# Patient Record
Sex: Male | Born: 1995 | Race: White | Hispanic: No | Marital: Single | Smoking: Former smoker
Health system: Southern US, Community
[De-identification: ages and names within clinical notes are randomized; demographics above are authoritative.]

## PROBLEM LIST (undated history)

## (undated) DIAGNOSIS — E119 Type 2 diabetes mellitus without complications: Secondary | ICD-10-CM

## (undated) DIAGNOSIS — J45909 Unspecified asthma, uncomplicated: Secondary | ICD-10-CM

---
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
## 2020-07-05 DIAGNOSIS — J029 Acute pharyngitis, unspecified: Secondary | ICD-10-CM | POA: Diagnosis not present

## 2020-07-05 DIAGNOSIS — Z6841 Body Mass Index (BMI) 40.0 and over, adult: Secondary | ICD-10-CM | POA: Diagnosis not present

## 2021-04-25 ENCOUNTER — Ambulatory Visit
Admission: EM | Admit: 2021-04-25 | Discharge: 2021-04-25 | Disposition: A | Discharge status: Home / Self Care | Payer: Commercial Managed Care - PPO | Attending: Family Medicine | Admitting: Family Medicine

## 2021-04-25 ENCOUNTER — Other Ambulatory Visit: Payer: Self-pay

## 2021-04-25 DIAGNOSIS — I809 Phlebitis and thrombophlebitis of unspecified site: Secondary | ICD-10-CM | POA: Diagnosis not present

## 2021-04-25 DIAGNOSIS — L97519 Non-pressure chronic ulcer of other part of right foot with unspecified severity: Secondary | ICD-10-CM

## 2021-04-25 HISTORY — DX: Type 2 diabetes mellitus without complications: E11.9

## 2021-04-25 MED ORDER — SULFAMETHOXAZOLE-TRIMETHOPRIM 800-160 MG PO TABS: 1.0000 | ORAL_TABLET | Freq: Two times a day (BID) | ORAL | 0 refills | Status: AC

## 2021-04-25 NOTE — Discharge Instructions (Addendum)
May call vascular imaging center at [phone] Mon through Fri 8-5 to schedule a DVT ultrasound if your symptoms are not improving

## 2021-04-25 NOTE — ED Provider Notes (Signed)
[facility]    CSN: [id number] Arrival date & time: 04/25/21  1818      History   Chief Complaint Chief Complaint  Patient presents with   Foot Pain    HPI Daryl Vaughn is a 25 y.o. male.   Pt presents with right sided lower leg pain, redness, edema x 4 days. Pt does have healing sore on the bottom of his right foot but states this has been ongoing and improving for several weeks. 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  No past history of DVT, PE.  Denies chest pain, shortness of breath, palpitations, dizziness.     Past Medical History:  Diagnosis Date   Asthma    Diabetes mellitus without complication (HCC)     There are no problems to display for this patient.   History reviewed. No pertinent surgical history.     Home Medications    Prior to Admission medications   Medication Sig Start Date End Date Taking? Authorizing Provider  sulfamethoxazole-trimethoprim (BACTRIM DS) 800-160 MG tablet Take 1 tablet by mouth 2 (two) times daily for 7 days. 04/25/21 05/02/21 Yes Particia Nearing, PA-C  amoxicillin (AMOXIL) 500 MG capsule Take 1 capsule (500 mg total) by mouth 3 (three) times daily. 10/26/12   Janne Napoleon, NP  ibuprofen (ADVIL,MOTRIN) 200 MG tablet Take 200 mg by mouth once as needed for pain.    [provider]  lisdexamfetamine (VYVANSE) 70 MG capsule Take 70 mg by mouth every morning.    [provider]    Family History Family History  Problem Relation Age of Onset   Diabetes Mother    Healthy Father     Social History Social History   Tobacco Use   Smoking status: Never  Substance Use Topics   Alcohol use: No     Allergies   Patient has no  known allergies.   Review of Systems Review of Systems PER HPI   Physical Exam Triage Vital Signs ED Triage Vitals  Enc Vitals Group     BP 04/25/21 1844 (!) 152/86     Pulse Rate 04/25/21 1844 93     Resp 04/25/21 1844 19     Temp 04/25/21 1844 98.7 F (37.1 C)     Temp src --      SpO2 04/25/21 1844 95 %     Weight --      Height --      Head Circumference --      Peak Flow --      Pain Score 04/25/21 1843 4     Pain Loc --      Pain Edu? --      Excl. in GC? --    No data found.  Updated Vital Signs BP (!) 152/86    Pulse 93    Temp 98.7 F (37.1 C)    Resp 19    SpO2 95%   Visual Acuity Right Eye Distance:   Left Eye Distance:   Bilateral Distance:    Right Eye Near:   Left Eye Near:    Bilateral Near:     Physical Exam Vitals and nursing note reviewed.  Constitutional:  Appearance: Normal appearance.  HENT:     Head: Atraumatic.  Eyes:     Extraocular Movements: Extraocular movements intact.     Conjunctiva/sclera: Conjunctivae normal.  Cardiovascular:     Rate and Rhythm: Normal rate and regular rhythm.  Pulmonary:     Effort: Pulmonary effort is normal.     Breath sounds: Normal breath sounds.  Musculoskeletal:        General: Swelling and tenderness present. Normal range of motion.     Cervical back: Normal range of motion and neck supple.     Comments: No posterior calf tenderness, swelling.  Negative Homans' sign, negative squeeze test  Skin:    General: Skin is warm.     Findings: Erythema present.     Comments: Right medial lower leg just above the ankle with firm tender erythematous nodular region with streaking and erythema, warmth extending down into medial foot.  Superficial ulceration at base of right foot, no erythema, bleeding, drainage, edema to the area.  Patient states this area of ulceration has been ongoing for several weeks.  Neurological:     General: No focal deficit present.     Mental Status: He is oriented to person,  place, and time.     Comments: Right lower leg neurovascularly intact  Psychiatric:        Mood and Affect: Mood normal.        Thought Content: Thought content normal.        Judgment: Judgment normal.     UC Treatments / Results  Labs (all labs ordered are listed, but only abnormal results are displayed) Labs Reviewed - No data to display  EKG   Radiology No results found.  Procedures Procedures (including critical care time)  Medications Ordered in UC Medications - No data to display  Initial Impression / Assessment and Plan / UC Course  I have reviewed the triage vital signs and the nursing notes.  Pertinent labs & imaging results that were available during my care of the patient were reviewed by me and considered in my medical decision making (see chart for details).     Low suspicion for DVT, suspect superficial phlebitis to be causing symptoms.  We will order a DVT ultrasound for rule out and safety check, given information for calling and scheduling this as it is currently after hours.  Discussed to go to the emergency department if worsening symptoms at any time.  We will treat with Bactrim, warm compresses, compression and monitor for improvement.  Work note given.  Final Clinical Impressions(s) / UC Diagnoses   Final diagnoses:  Superficial phlebitis  Ulcer of right foot, unspecified ulcer stage [facility])     Discharge Instructions      May call vascular imaging center at [phone] Mon through Fri 8-5 to schedule a DVT ultrasound if your symptoms are not improving    ED Prescriptions     Medication Sig Dispense Auth. Provider   sulfamethoxazole-trimethoprim (BACTRIM DS) 800-160 MG tablet Take 1 tablet by mouth 2 (two) times daily for 7 days. 14 tablet Particia Nearing, [place]      PDMP not reviewed this encounter.   Particia Nearing, [place] 04/25/21 1930

## 2021-04-25 NOTE — ED Triage Notes (Signed)
Pt presents with right sided lower leg pain x 4 days. Area is red warm and hard to touch on the inner side of lower leg. Pt does have healing sore on the bottom of his foot. Denies any injury.

## 2021-04-26 ENCOUNTER — Other Ambulatory Visit (HOSPITAL_COMMUNITY): Payer: Self-pay | Admitting: Family Medicine

## 2021-04-26 ENCOUNTER — Inpatient Hospital Stay (HOSPITAL_COMMUNITY): Admission: RE | Admit: 2021-04-26 | Payer: Commercial Managed Care - PPO | Source: Ambulatory Visit

## 2021-04-26 DIAGNOSIS — M79604 Pain in right leg: Secondary | ICD-10-CM

## 2022-04-13 ENCOUNTER — Ambulatory Visit
Admission: EM | Admit: 2022-04-13 | Discharge: 2022-04-13 | Disposition: A | Discharge status: Home / Self Care | Payer: Commercial Managed Care - PPO

## 2022-04-13 DIAGNOSIS — H6122 Impacted cerumen, left ear: Secondary | ICD-10-CM

## 2022-04-13 NOTE — ED Provider Notes (Signed)
[facility]    CSN: [id number] Arrival date & time: 04/13/22  1435      History   Chief Complaint Chief Complaint  Patient presents with   Otalgia          HPI SENECA HOBACK is a 26 y.o. male.   The history is provided by the patient.   [place] for complaints of decreased hearing from the left ear.  Patient states when he woke up this morning, he could not hear from the left ear.  He denies fever, chills, an upper respiratory infection, ear drainage, or ear pain.  Patient denies previous history of cerumen impaction.  Past Medical History:  Diagnosis Date   Asthma    Diabetes mellitus without complication (HCC)     There are no problems to display for this patient.   History reviewed. No pertinent surgical history.     Home Medications    Prior to Admission medications   Medication Sig Start Date End Date Taking? Authorizing Provider  testosterone cypionate (DEPOTESTOSTERONE CYPIONATE) 200 MG/ML injection Inject into the muscle every 14 (fourteen) days.   Yes [provider]  amoxicillin (AMOXIL) 500 MG capsule Take 1 capsule (500 mg total) by mouth 3 (three) times daily. 10/26/12   Janne Napoleon, NP  ibuprofen (ADVIL,MOTRIN) 200 MG tablet Take 200 mg by mouth once as needed for pain.    [provider]  lisdexamfetamine (VYVANSE) 70 MG capsule Take 70 mg by mouth every morning.    [provider]    Family History Family History  Problem Relation Age of Onset   Diabetes Mother    Healthy Father     Social History Social History   Tobacco Use   Smoking status: Former    Types: Cigarettes    Quit date: 01/03/2022    Years since quitting: 0.2  Substance Use Topics   Alcohol use: Yes    Comment: Occ   Drug use: Never     Allergies   Patient has no known allergies.   Review of Systems Review of Systems Per HPI  Physical Exam Triage Vital Signs ED Triage Vitals  Enc Vitals Group     BP 04/13/22 1526  (!) 155/95     Pulse Rate 04/13/22 1526 62     Resp 04/13/22 1526 18     Temp 04/13/22 1526 97.6 F (36.4 C)     Temp Source 04/13/22 1526 Oral     SpO2 04/13/22 1526 97 %     Weight --      Height --      Head Circumference --      Peak Flow --      Pain Score 04/13/22 1527 3     Pain Loc --      Pain Edu? --      Excl. in GC? --    No data found.  Updated Vital Signs BP (!) 155/95 (BP Location: Right Arm)   Pulse 62   Temp 97.6 F (36.4 C) (Oral)   Resp 18   SpO2 97%   Visual Acuity Right Eye Distance:   Left Eye Distance:   Bilateral Distance:    Right Eye Near:   Left Eye Near:    Bilateral Near:     Physical Exam Vitals and nursing note reviewed.  Constitutional:      General: He is not in acute distress.    Appearance: Normal appearance.  HENT:     Right  Ear: Tympanic membrane, ear canal and external ear normal.     Left Ear: There is impacted cerumen.     Nose: Nose normal.     Mouth/Throat:     Mouth: Mucous membranes are moist.  Eyes:     Extraocular Movements: Extraocular movements intact.     Pupils: Pupils are equal, round, and reactive to light.  Pulmonary:     Effort: Pulmonary effort is normal.  Musculoskeletal:     Cervical back: Normal range of motion.  Skin:    General: Skin is warm and dry.  Neurological:     General: No focal deficit present.     Mental Status: He is alert and oriented to person, place, and time.  Psychiatric:        Mood and Affect: Mood normal.        Behavior: Behavior normal.      UC Treatments / Results  Labs (all labs ordered are listed, but only abnormal results are displayed) Labs Reviewed - No data to display  EKG   Radiology No results found.  Procedures Procedures (including critical care time)  Medications Ordered in UC Medications - No data to display  Initial Impression / Assessment and Plan / UC Course  I have reviewed the triage vital signs and the nursing notes.  Pertinent labs &  imaging results that were available during my care of the patient were reviewed by me and considered in my medical decision making (see chart for details).  The patient is well-appearing, he is in no acute distress, vital signs are stable.  Left ear with cerumen impaction.  Ear irrigation was performed, with complete evacuation of the obstruction.  Patient tolerated the procedure well, and reports good results.  Patient was advised to use over-the-counter Debrox earwax softener as needed.  Patient verbalizes understanding.  All questions were answered.  Patient stable for discharge. Final Clinical Impressions(s) / UC Diagnoses   Final diagnoses:  Impacted cerumen of left ear     Discharge Instructions      May administer over-the-counter Debrox or earwax softener to prevent further wax buildup. May take Tylenol or ibuprofen as needed for pain or discomfort. Follow-up as needed.     ED Prescriptions   None    PDMP not reviewed this encounter.   Tish Men, NP 04/13/22 1627

## 2022-04-13 NOTE — ED Triage Notes (Signed)
Pt reports he can't hear from left ear since this morning.

## 2022-04-13 NOTE — Discharge Instructions (Addendum)
May administer over-the-counter Debrox or earwax softener to prevent further wax buildup. May take Tylenol or ibuprofen as needed for pain or discomfort. Follow-up as needed.

## 2022-06-18 ENCOUNTER — Ambulatory Visit
Admission: EM | Admit: 2022-06-18 | Discharge: 2022-06-18 | Disposition: A | Discharge status: Home / Self Care | Payer: Commercial Managed Care - PPO | Attending: Internal Medicine | Admitting: Internal Medicine

## 2022-06-18 DIAGNOSIS — J039 Acute tonsillitis, unspecified: Secondary | ICD-10-CM | POA: Diagnosis present

## 2022-06-18 LAB — POCT RAPID STREP A (OFFICE): Rapid Strep A Screen: NEGATIVE

## 2022-06-18 MED ORDER — PREDNISONE 20 MG PO TABS: 20.0000 mg | ORAL_TABLET | Freq: Every day | ORAL | 0 refills | Status: DC

## 2022-06-18 MED ORDER — AMOXICILLIN-POT CLAVULANATE 875-125 MG PO TABS: 1.0000 | ORAL_TABLET | Freq: Two times a day (BID) | ORAL | 0 refills | Status: DC

## 2022-06-18 NOTE — ED Provider Notes (Signed)
[facility]    CSN: [id number] Arrival date & time: 06/18/22  1455      History   Chief Complaint No chief complaint on file.   HPI Daryl Vaughn is a 27 y.o. male who presents with ST x 3 days and is hard to swallow and intermittent pain and fever x 3 days. Has been feeling L ear pain off and on.     Past Medical History:  Diagnosis Date   Asthma    Diabetes mellitus without complication ([place])     There are no problems to display for this patient.   History reviewed. No pertinent surgical history.     Home Medications    Prior to Admission medications   Medication Sig Start Date End Date Taking? Authorizing Provider  amoxicillin-clavulanate (AUGMENTIN) 875-125 MG tablet Take 1 tablet by mouth every 12 (twelve) hours. 06/18/22  Yes Rodriguez-Southworth, Sunday Spillers, PA-C  predniSONE (DELTASONE) 20 MG tablet Take 1 tablet (20 mg total) by mouth daily with breakfast. 06/18/22  Yes Rodriguez-Southworth, Sunday Spillers, PA-C  ibuprofen (ADVIL,MOTRIN) 200 MG tablet Take 200 mg by mouth once as needed for pain.    [provider]  lisdexamfetamine (VYVANSE) 70 MG capsule Take 70 mg by mouth every morning.    [provider]  testosterone cypionate (DEPOTESTOSTERONE CYPIONATE) 200 MG/ML injection Inject into the muscle every 14 (fourteen) days.    [provider]    Family History Family History  Problem Relation Age of Onset   Diabetes Mother    Healthy Father     Social History Social History   Tobacco Use   Smoking status: Former    Types: Cigarettes    Quit date: 01/03/2022    Years since quitting: 0.4  Substance Use Topics   Alcohol use: Yes    Comment: Occ   Drug use: Never     Allergies   Patient has no known allergies.   Review of Systems Review of Systems  Constitutional:  Positive for chills and fever. Negative for diaphoresis and fatigue.  HENT:  Positive for ear discharge, ear pain and sore throat.   Respiratory:   Negative for cough.   Hematological:  Negative for adenopathy.     Physical Exam Triage Vital Signs ED Triage Vitals  Enc Vitals Group     BP 06/18/22 1511 (!) 149/86     Pulse Rate 06/18/22 1511 65     Resp 06/18/22 1511 20     Temp 06/18/22 1511 98 F (36.7 C)     Temp Source 06/18/22 1511 Oral     SpO2 06/18/22 1511 98 %     Weight --      Height --      Head Circumference --      Peak Flow --      Pain Score 06/18/22 1513 3     Pain Loc --      Pain Edu? --      Excl. in [place]? --    No data found.  Updated Vital Signs BP (!) 149/86 (BP Location: Right Arm)   Pulse 65   Temp 98 F (36.7 C) (Oral)   Resp 20   SpO2 98%   Visual Acuity Right Eye Distance:   Left Eye Distance:   Bilateral Distance:    Right Eye Near:   Left Eye Near:    Bilateral Near:     Physical Exam Vitals and nursing note reviewed.  Constitutional:  General: He is not in acute distress.    Appearance: He is obese. He is not toxic-appearing.  HENT:     Right Ear: Tympanic membrane, ear canal and external ear normal.     Left Ear: Tympanic membrane, ear canal and external ear normal.     Nose: Nose normal.     Mouth/Throat:     Mouth: Mucous membranes are moist.     Pharynx: Posterior oropharyngeal erythema present. No oropharyngeal exudate or uvula swelling.     Tonsils: No tonsillar exudate or tonsillar abscesses. 3+ on the right. 2+ on the left.  Eyes:     General: No scleral icterus.    Conjunctiva/sclera: Conjunctivae normal.  Cardiovascular:     Rate and Rhythm: Normal rate and regular rhythm.     Heart sounds: No murmur heard. Pulmonary:     Effort: Pulmonary effort is normal.     Breath sounds: Normal breath sounds.  Musculoskeletal:        General: Normal range of motion.     Cervical back: Neck supple.  Lymphadenopathy:     Cervical: Cervical adenopathy present.  Skin:    General: Skin is warm and dry.  Neurological:     Mental Status: He is alert and oriented to  person, place, and time.  Psychiatric:        Mood and Affect: Mood normal.        Behavior: Behavior normal.        Thought Content: Thought content normal.      UC Treatments / Results  Labs (all labs ordered are listed, but only abnormal results are displayed) Labs Reviewed  CULTURE, GROUP A STREP [facility])  POCT RAPID STREP A (OFFICE)  Rapid strep is negative  EKG   Radiology No results found.  Procedures Procedures (including critical care time)  Medications Ordered in UC Medications - No data to display  Initial Impression / Assessment and Plan / UC Course  I have reviewed the triage vital signs and the nursing notes.  Pertinent labs  results that were available during my care of the patient were reviewed by me and considered in my medical decision making (see chart for details).  Tonsillitis  I am concerned he may be getting peritonsillar abscess on the R since that tonsil is larger. I started him on Augmentin and Prednisone as noted Throat culture ordered and we will inform him if positive. But even if negative, if his throat is improving with med combo, he may finish it    Final Clinical Impressions(s) / UC Diagnoses   Final diagnoses:  Acute tonsillitis, unspecified etiology     Discharge Instructions      I am concerned you may be getting a peritonsillar abscess on the R, so I will start you on antibiotics and prednisone to help the swelling. I am ordering a throat culture as well and we will call you if positive     ED Prescriptions     Medication Sig Dispense Auth. Provider   predniSONE (DELTASONE) 20 MG tablet Take 1 tablet (20 mg total) by mouth daily with breakfast. 3 tablet Rodriguez-Southworth, Sunday Spillers, PA-C   amoxicillin-clavulanate (AUGMENTIN) 875-125 MG tablet Take 1 tablet by mouth every 12 (twelve) hours. 20 tablet Rodriguez-Southworth, Sunday Spillers, PA-C      PDMP not reviewed this encounter.   Shelby Mattocks, [place] 06/18/22  [phone]

## 2022-06-18 NOTE — Discharge Instructions (Signed)
I am concerned you may be getting a peritonsillar abscess on the R, so I will start you on antibiotics and prednisone to help the swelling. I am ordering a throat culture as well and we will call you if positive

## 2022-06-18 NOTE — ED Triage Notes (Signed)
Pt reports he has been having a sore throat, hard to swallow  left ear pain and fever x 3 days

## 2022-06-19 LAB — CULTURE, GROUP A STREP (THRC)

## 2022-06-20 LAB — CULTURE, GROUP A STREP (THRC)

## 2022-06-21 LAB — CULTURE, GROUP A STREP (THRC)

## 2022-08-27 ENCOUNTER — Encounter: Payer: Self-pay | Admitting: Emergency Medicine

## 2022-08-27 ENCOUNTER — Ambulatory Visit
Admission: EM | Admit: 2022-08-27 | Discharge: 2022-08-27 | Disposition: A | Discharge status: Home / Self Care | Payer: Commercial Managed Care - PPO | Attending: Family Medicine | Admitting: Family Medicine

## 2022-08-27 DIAGNOSIS — H6121 Impacted cerumen, right ear: Secondary | ICD-10-CM | POA: Diagnosis not present

## 2022-08-27 NOTE — ED Triage Notes (Signed)
States can not hear well out of both ear that stated tonight.

## 2022-08-28 NOTE — ED Provider Notes (Signed)
  [facility]   [id number] 08/27/22 Arrival Time: 1927  ASSESSMENT & PLAN:  1. Impacted cerumen of right ear    Better after flushing ear. No signs of infection. Slight bilateral serous otitis. Rec OTC allergy medication if he wishes.    Follow-up Information     [facility] at [facility].   Specialty: Urgent Care Why: As needed. Contact information: [address], [address] [place] [address] [phone]                Reviewed expectations re: course of current medical issues. Questions answered. Outlined signs and symptoms indicating need for more acute intervention. Understanding verbalized. After Visit Summary given.   SUBJECTIVE: History from: Patient. Daryl Vaughn is a 27 y.o. male. Reports: States can not hear well out of both ear that stated tonight. Denies: fever. Normal PO intake without n/v/d.  OBJECTIVE:  Vitals:   08/27/22 1934  BP: (!) 148/83  Pulse: 98  Resp: 18  Temp: 98.1 F (36.7 C)  TempSrc: Oral  SpO2: 96%    General appearance: alert; no distress Eyes: PERRLA; EOMI; conjunctiva normal HENT: [place]; AT; without nasal congestion; R cerumen impaction Neck: supple  Lungs: speaks full sentences without difficulty; unlabored Extremities: no edema Skin: warm and dry Neurologic: normal gait Psychological: alert and cooperative; normal mood and affect  No Known Allergies  Past Medical History:  Diagnosis Date   Asthma    Diabetes mellitus without complication    Social History   Socioeconomic History   Marital status: Single    Spouse name: Not on file   Number of children: Not on file   Years of education: Not on file   Highest education level: Not on file  Occupational History   Not on file  Tobacco Use   Smoking status: Former    Types: Cigarettes    Quit date: 01/03/2022    Years since quitting: 0.6   Smokeless tobacco: Not on file  Vaping Use   Vaping Use: Never used   Substance and Sexual Activity   Alcohol use: Yes    Comment: Occ   Drug use: Never   Sexual activity: Yes  Other Topics Concern   Not on file  Social History Narrative   Not on file   Social Determinants of Health   Financial Resource Strain: Not on file  Food Insecurity: Not on file  Transportation Needs: Not on file  Physical Activity: Not on file  Stress: Not on file  Social Connections: Not on file  Intimate Partner Violence: Not on file   Family History  Problem Relation Age of Onset   Diabetes Mother    Healthy Father    History reviewed. No pertinent surgical history.   Mardella Layman, MD 08/28/22 [phone]

## 2022-09-21 ENCOUNTER — Encounter: Payer: Self-pay | Admitting: Emergency Medicine

## 2022-09-21 ENCOUNTER — Other Ambulatory Visit: Payer: Self-pay

## 2022-09-21 ENCOUNTER — Ambulatory Visit
Admission: EM | Admit: 2022-09-21 | Discharge: 2022-09-21 | Disposition: A | Discharge status: Home / Self Care | Payer: Commercial Managed Care - PPO

## 2022-09-21 DIAGNOSIS — M546 Pain in thoracic spine: Secondary | ICD-10-CM | POA: Diagnosis not present

## 2022-09-21 MED ORDER — DEXAMETHASONE SODIUM PHOSPHATE 10 MG/ML IJ SOLN
10.0000 mg | Freq: Once | INTRAMUSCULAR | Status: AC
  Administered 2022-09-21: 10 mg via INTRAMUSCULAR

## 2022-09-21 MED ORDER — IBUPROFEN 800 MG PO TABS: 800.0000 mg | ORAL_TABLET | Freq: Three times a day (TID) | ORAL | 0 refills | Status: AC

## 2022-09-21 MED ORDER — CYCLOBENZAPRINE HCL 10 MG PO TABS: 10.0000 mg | ORAL_TABLET | Freq: Every evening | ORAL | 0 refills | Status: AC | PRN

## 2022-09-21 NOTE — ED Triage Notes (Signed)
Pt reports left sided back pain since moving armoire yesterday. Pt reports was at home and had several people lifting with him but reports went they push armoire before pt was ready and reports twisted back during this process and states back pain ever since. Tylenol taking pta to arrival.  Denies any gi/gu symptoms. Gait steady.

## 2022-09-21 NOTE — ED Provider Notes (Signed)
[facility]   [id number] 09/21/22 Arrival Time: 1001   Chief Complaint  Patient presents with   Back Pain     SUBJECTIVE: History from: .  Daryl Vaughn is a 27 y.o. male who presented to the urgent care for complaint of left back pain for the past few days.  Developed the symptoms after moving an armoire.  He localizes the pain to the left middle back.  He describes the pain as constant and achy.  He has tried OTC medications without relief.  His symptoms are made worse with ROM.  He denies similar symptoms in the past.   He denies chills, fever, nausea, vomiting, diarrhea   ROS: As per HPI.  All other pertinent ROS negative.      Past Medical History:  Diagnosis Date   Asthma    Diabetes mellitus without complication (HCC)    History reviewed. No pertinent surgical history. No Known Allergies No current facility-administered medications on file prior to encounter.   Current Outpatient Medications on File Prior to Encounter  Medication Sig Dispense Refill   doxycycline (VIBRAMYCIN) 100 MG capsule Take 100 mg by mouth daily.     methylphenidate 54 MG PO CR tablet Take 54 mg by mouth daily.     clomiPHENE (CLOMID) 50 MG tablet Take 50 mg by mouth daily.     methylphenidate 18 MG PO CR tablet Take 18 mg by mouth daily.     testosterone cypionate (DEPOTESTOSTERONE CYPIONATE) 200 MG/ML injection Inject into the muscle once a week.     tirzepatide [facility]) 2.5 MG/0.5ML Pen Inject 2.5 mg into the skin once a week.     Social History   Socioeconomic History   Marital status: Single    Spouse name: Not on file   Number of children: Not on file   Years of education: Not on file   Highest education level: Not on file  Occupational History   Not on file  Tobacco Use   Smoking status: Former    Types: Cigarettes    Quit date: 01/03/2022    Years since quitting: 0.7   Smokeless tobacco: Not on file  Vaping Use   Vaping Use: Never used  Substance and Sexual  Activity   Alcohol use: Never   Drug use: Never   Sexual activity: Yes  Other Topics Concern   Not on file  Social History Narrative   Not on file   Social Determinants of Health   Financial Resource Strain: Not on file  Food Insecurity: Not on file  Transportation Needs: Not on file  Physical Activity: Not on file  Stress: Not on file  Social Connections: Not on file  Intimate Partner Violence: Not on file   Family History  Problem Relation Age of Onset   Diabetes Mother    Healthy Father     OBJECTIVE:  Vitals:   09/21/22 1021  BP: (!) 154/91  Pulse: 75  Resp: 20  Temp: 98.8 F (37.1 C)  TempSrc: Oral  SpO2: 97%     Physical Exam Vitals and nursing note reviewed.  Constitutional:      General: He is not in acute distress.    Appearance: Normal appearance. He is normal weight. He is not ill-appearing, toxic-appearing or diaphoretic.  Cardiovascular:     Rate and Rhythm: Normal rate and regular rhythm.     Pulses: Normal pulses.     Heart sounds: Normal heart sounds. No murmur heard.    No friction  rub. No gallop.  Pulmonary:     Effort: Pulmonary effort is normal. No respiratory distress.     Breath sounds: Normal breath sounds. No stridor. No wheezing, rhonchi or rales.  Chest:     Chest wall: No tenderness.  Musculoskeletal:     Cervical back: Normal.     Thoracic back: Spasms and tenderness present.     Lumbar back: Normal.  Neurological:     Mental Status: He is alert and oriented to person, place, and time.      LABS:  No results found for this or any previous visit (from the past 24 hour(s)).   ASSESSMENT & PLAN:  1. Acute left-sided thoracic back pain     Meds ordered this encounter  Medications   cyclobenzaprine (FLEXERIL) 10 MG tablet    Sig: Take 1 tablet (10 mg total) by mouth at bedtime as needed for muscle spasms.    Dispense:  30 tablet    Refill:  0   ibuprofen (ADVIL) 800 MG tablet    Sig: Take 1 tablet (800 mg total) by  mouth 3 (three) times daily. Take it with food    Dispense:  21 tablet    Refill:  0   dexamethasone (DECADRON) injection 10 mg    Discharge Instructions  Decadron IM was given in office Flexeril was prescribed for muscle spasm.  Do not drive or operate machinery while taking this medication Ibuprofen was prescribed for pain/ please take it with food Follow-up with PCP Return or go to ED if you develop any new or worsening of your symptoms   Reviewed expectations re: course of current medical issues. Questions answered. Outlined signs and symptoms indicating need for more acute intervention. Patient verbalized understanding. After Visit Summary given.          Durward Parcel, FNP 09/21/22 1046

## 2022-09-21 NOTE — Discharge Instructions (Addendum)
Decadron IM was given in office Flexeril was prescribed for muscle spasm.  Do not drive or operate machinery while taking this medication Ibuprofen was prescribed for pain/ please take it with food Follow-up with PCP Return or go to ED if you develop any new or worsening of your symptoms

## 2022-11-10 ENCOUNTER — Ambulatory Visit
Admission: EM | Admit: 2022-11-10 | Discharge: 2022-11-10 | Disposition: A | Discharge status: Home / Self Care | Payer: Commercial Managed Care - PPO | Attending: Family Medicine | Admitting: Family Medicine

## 2022-11-10 DIAGNOSIS — U071 COVID-19: Secondary | ICD-10-CM | POA: Diagnosis not present

## 2022-11-10 MED ORDER — PROMETHAZINE-DM 6.25-15 MG/5ML PO SYRP: 5.0000 mL | ORAL_SOLUTION | Freq: Four times a day (QID) | ORAL | 0 refills | Status: AC | PRN

## 2022-11-10 NOTE — ED Triage Notes (Signed)
Pt reports people are sick at work and when he tested for covid he was positive earlier today. Pt can't taste or smell.

## 2022-11-10 NOTE — ED Provider Notes (Signed)
[facility]    CSN: [id number] Arrival date & time: 11/10/22  1652      History   Chief Complaint Chief Complaint  Patient presents with   Covid Positive    HPI Daryl Vaughn is a 27 y.o. male.   Patient presenting today with loss of taste and smell, cough, congestion that started this morning.  He tested positive for COVID-19 earlier today.  Denies fever, chills, chest pain, shortness of breath, abdominal pain, nausea vomiting or diarrhea.  So far trying DayQuil with mild temporary benefit.  History of childhood asthma, no issues as an adult with his breathing that he is aware of.    Past Medical History:  Diagnosis Date   Asthma    Diabetes mellitus without complication (HCC)     There are no problems to display for this patient.   History reviewed. No pertinent surgical history.     Home Medications    Prior to Admission medications   Medication Sig Start Date End Date Taking? Authorizing Provider  promethazine-dextromethorphan (PROMETHAZINE-DM) 6.25-15 MG/5ML syrup Take 5 mLs by mouth 4 (four) times daily as needed. 11/10/22  Yes Particia Nearing, PA-C  clomiPHENE (CLOMID) 50 MG tablet Take 50 mg by mouth daily.    [provider]  cyclobenzaprine (FLEXERIL) 10 MG tablet Take 1 tablet (10 mg total) by mouth at bedtime as needed for muscle spasms. 09/21/22   Avegno, Zachery Dakins, FNP  doxycycline (VIBRAMYCIN) 100 MG capsule Take 100 mg by mouth daily. 05/06/21   [provider]  ibuprofen (ADVIL) 800 MG tablet Take 1 tablet (800 mg total) by mouth 3 (three) times daily. Take it with food 09/21/22   Avegno, Zachery Dakins, FNP  methylphenidate 18 MG PO CR tablet Take 18 mg by mouth daily.    [provider]  methylphenidate 54 MG PO CR tablet Take 54 mg by mouth daily. 08/04/22   [provider]  testosterone cypionate (DEPOTESTOSTERONE CYPIONATE) 200 MG/ML injection Inject into the muscle once a week.    [provider]  tirzepatide Gregg[facility] Keen) 2.5 MG/0.5ML Pen Inject 2.5 mg into the skin once a week.    [provider]    Family History Family History  Problem Relation Age of Onset   Diabetes Mother    Healthy Father     Social History Social History   Tobacco Use   Smoking status: Former    Types: Cigarettes    Quit date: 01/03/2022    Years since quitting: 0.8  Vaping Use   Vaping Use: Never used  Substance Use Topics   Alcohol use: Never   Drug use: Never     Allergies   Patient has no known allergies.   Review of Systems Review of Systems Per HPI  Physical Exam Triage Vital Signs ED Triage Vitals  Enc Vitals Group     BP 11/10/22 1725 (!) 143/86     Pulse Rate 11/10/22 1725 78     Resp 11/10/22 1725 18     Temp 11/10/22 1725 97.9 F (36.6 C)     Temp Source 11/10/22 1725 Oral     SpO2 11/10/22 1725 98 %     Weight --      Height --      Head Circumference --      Peak Flow --      Pain Score 11/10/22 1727 0     Pain Loc --      Pain Edu? --  Excl. in GC? --    No data found.  Updated Vital Signs BP (!) 143/86 (BP Location: Right Arm)   Pulse 78   Temp 97.9 F (36.6 C) (Oral)   Resp 18   SpO2 98%   Visual Acuity Right Eye Distance:   Left Eye Distance:   Bilateral Distance:    Right Eye Near:   Left Eye Near:    Bilateral Near:     Physical Exam Vitals and nursing note reviewed.  Constitutional:      Appearance: He is well-developed.  HENT:     Head: Atraumatic.     Right Ear: External ear normal.     Left Ear: External ear normal.     Nose: Rhinorrhea present.     Mouth/Throat:     Pharynx: Posterior oropharyngeal erythema present. No oropharyngeal exudate.  Eyes:     Conjunctiva/sclera: Conjunctivae normal.     Pupils: Pupils are equal, round, and reactive to light.  Cardiovascular:     Rate and Rhythm: Normal rate and regular rhythm.  Pulmonary:     Effort: Pulmonary effort is normal. No respiratory distress.     Breath  sounds: No wheezing or rales.  Musculoskeletal:        General: Normal range of motion.     Cervical back: Normal range of motion and neck supple.  Lymphadenopathy:     Cervical: No cervical adenopathy.  Skin:    General: Skin is warm and dry.  Neurological:     Mental Status: He is alert and oriented to person, place, and time.  Psychiatric:        Behavior: Behavior normal.      UC Treatments / Results  Labs (all labs ordered are listed, but only abnormal results are displayed) Labs Reviewed - No data to display  EKG   Radiology No results found.  Procedures Procedures (including critical care time)  Medications Ordered in UC Medications - No data to display  Initial Impression / Assessment and Plan / UC Course  I have reviewed the triage vital signs and the nursing notes.  Pertinent labs & imaging results that were available during my care of the patient were reviewed by me and considered in my medical decision making (see chart for details).     Positive for COVID-19 on home test, will treat with Phenergan DM, over-the-counter cold and congestion medications and supportive home care.  Work note given.  Return for worsening symptoms.  Final Clinical Impressions(s) / UC Diagnoses   Final diagnoses:  COVID   Discharge Instructions   None    ED Prescriptions     Medication Sig Dispense Auth. Provider   promethazine-dextromethorphan (PROMETHAZINE-DM) 6.25-15 MG/5ML syrup Take 5 mLs by mouth 4 (four) times daily as needed. 100 mL Particia Nearing, [place]      PDMP not reviewed this encounter.   Roosvelt Maser [place], [place] 11/10/22 [phone]
